# Patient Record
Sex: Male | Born: 1970 | Hispanic: No | Marital: Married | Smoking: Never smoker
Health system: Southern US, Community
[De-identification: ages and names within clinical notes are randomized; demographics above are authoritative.]

## PROBLEM LIST (undated history)

## (undated) DIAGNOSIS — E079 Disorder of thyroid, unspecified: Secondary | ICD-10-CM

## (undated) DIAGNOSIS — E119 Type 2 diabetes mellitus without complications: Secondary | ICD-10-CM

---

## 2003-10-28 ENCOUNTER — Observation Stay (HOSPITAL_COMMUNITY): Admission: RE | Admit: 2003-10-28 | Discharge: 2003-10-29 | Payer: Self-pay | Admitting: Specialist

## 2005-04-06 IMAGING — CR DG LUMBAR SPINE 1V
1 series · 1 of 1 positions shown · non-contrast
Comparison: none

CLINICAL DATA: Surgery at L4-5.
 LATERAL LUMBAR SPINE 
 Lateral lumbar spine view labeled #1 shows the needle position posteriorly beneath the spinous process of L4 directed toward the L4-5 interspace. 
 IMPRESSION
 Needle beneath the spinous process of L4. 
 LATERAL LUMBAR SPINE
 Lateral lumbar spine view labeled #2 shows an instrument between the spinous processes of L3-4 and L4-5 for localization. 
 Localization of L3-4 and L4-5 on last film.

[view not recorded]
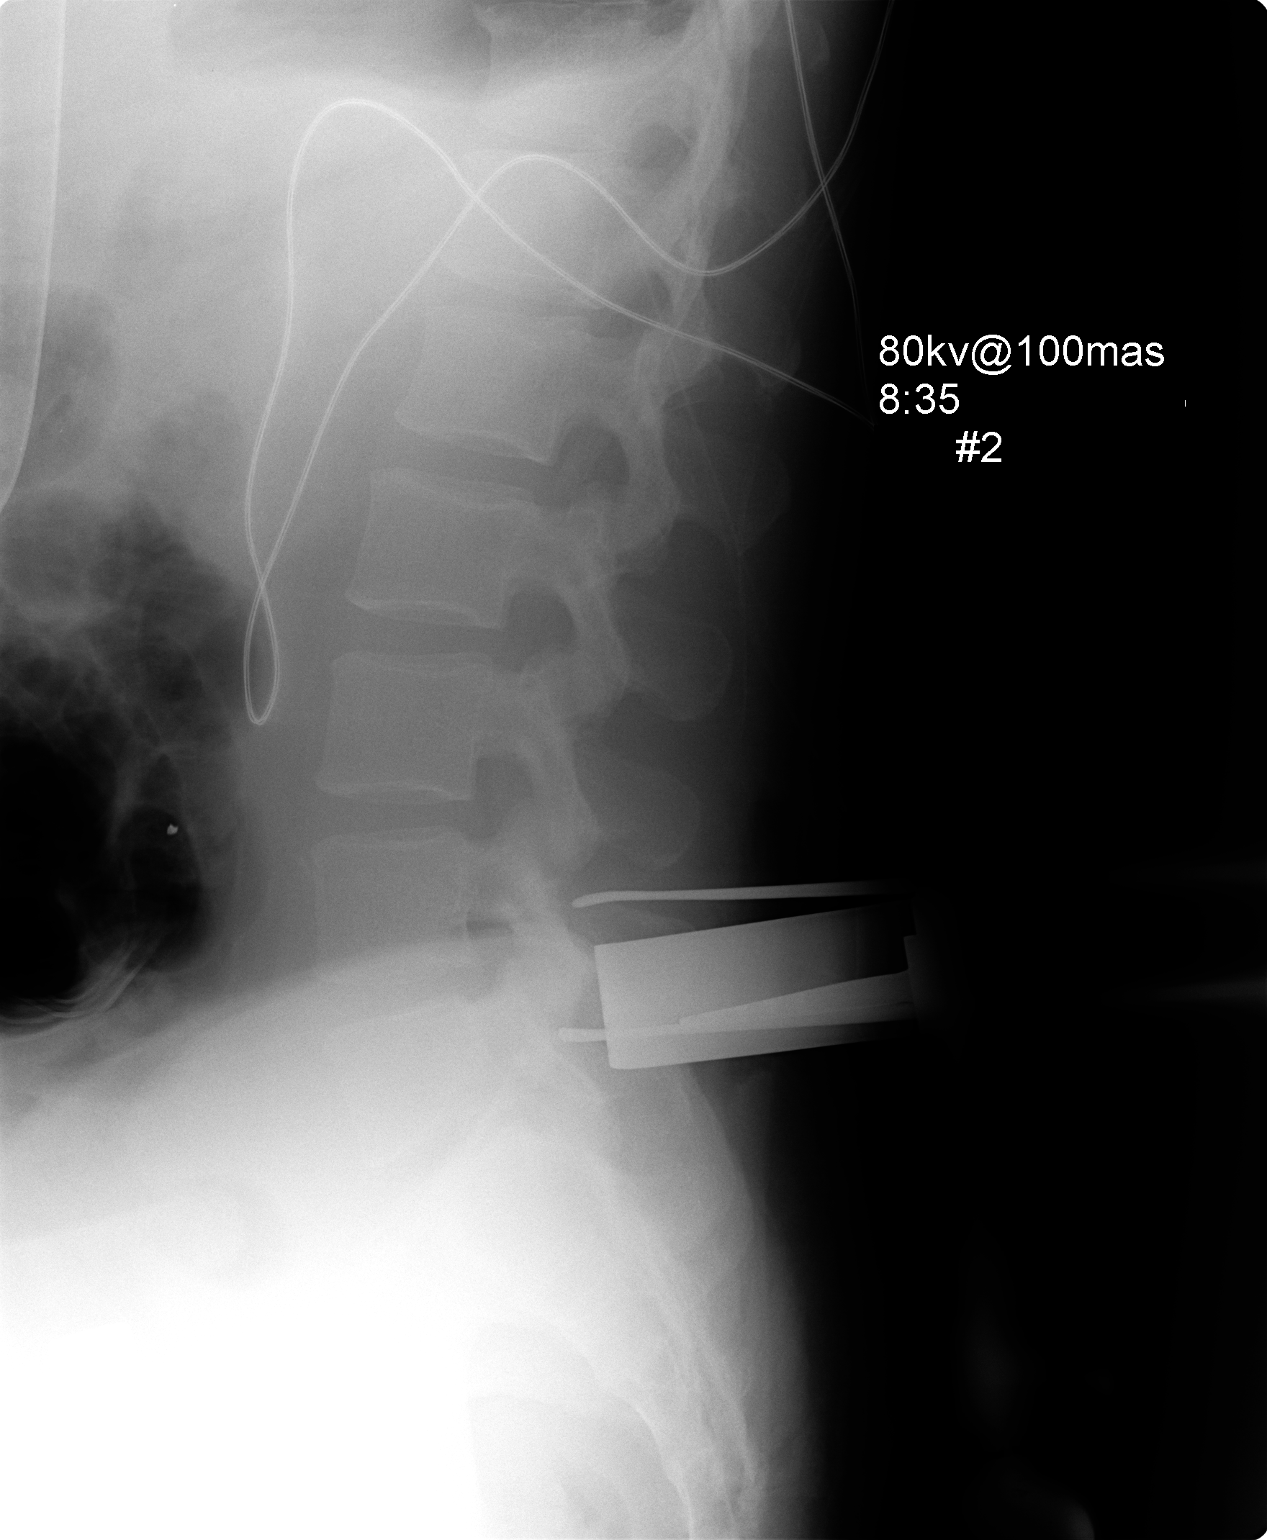

[1 of 1 positions shown; findings below may reference images not displayed]

## 2007-04-30 ENCOUNTER — Ambulatory Visit: Payer: Self-pay | Admitting: Cardiology

## 2022-12-28 ENCOUNTER — Emergency Department (HOSPITAL_COMMUNITY): Payer: Managed Care, Other (non HMO)

## 2022-12-28 ENCOUNTER — Encounter (HOSPITAL_COMMUNITY): Payer: Self-pay | Admitting: Emergency Medicine

## 2022-12-28 ENCOUNTER — Other Ambulatory Visit: Payer: Self-pay

## 2022-12-28 ENCOUNTER — Emergency Department (HOSPITAL_COMMUNITY)
Admission: EM | Admit: 2022-12-28 | Discharge: 2022-12-29 | Disposition: A | Discharge status: Home / Self Care | Payer: Managed Care, Other (non HMO) | Attending: Emergency Medicine | Admitting: Emergency Medicine

## 2022-12-28 DIAGNOSIS — M79674 Pain in right toe(s): Secondary | ICD-10-CM | POA: Diagnosis present

## 2022-12-28 DIAGNOSIS — L600 Ingrowing nail: Secondary | ICD-10-CM

## 2022-12-28 HISTORY — DX: Disorder of thyroid, unspecified: E07.9

## 2022-12-28 HISTORY — DX: Type 2 diabetes mellitus without complications: E11.9

## 2022-12-28 MED ORDER — LIDOCAINE HCL (PF) 2 % IJ SOLN
10.0000 mL | Freq: Once | INTRAMUSCULAR | Status: AC
  Administered 2022-12-29: 10 mL

## 2022-12-28 MED ORDER — LIDOCAINE HCL (PF) 2 % IJ SOLN
INTRAMUSCULAR | Status: AC
  Filled 2022-12-28: qty 10

## 2022-12-28 NOTE — ED Triage Notes (Addendum)
Pt c/o infection to right great toe. Toe appears to have infected area at nail bed. Hx DM2, hypothyroidism. Pain 7/10 burning, last tetanus shot was over 10 years ago. Recently treated for paronychia to left toes and symptoms returned after abx were completed.

## 2022-12-29 MED ORDER — CLINDAMYCIN HCL 150 MG PO CAPS: 300.0000 mg | ORAL_CAPSULE | Freq: Three times a day (TID) | ORAL | 0 refills | Status: AC

## 2022-12-29 MED ORDER — TRIPLE ANTIBIOTIC 3.5-400-5000 EX OINT
TOPICAL_OINTMENT | Freq: Once | CUTANEOUS | Status: AC
  Filled 2022-12-29: qty 1

## 2022-12-29 NOTE — ED Provider Notes (Signed)
[place] EMERGENCY DEPARTMENT AT [facility] Provider Note   CSN: [id number] Arrival date & time: 12/28/22  2202     History  Chief Complaint  Patient presents with   Foot Injury    Alejandro Oconnor is a 52 y.o. male.  Patient presents with progressively worsening pain and swelling of the right great toe over a year.  He weeks, much worse today.  No known injury.       Home Medications Prior to Admission medications   Medication Sig Start Date End Date Taking? Authorizing Provider  clindamycin (CLEOCIN) 150 MG capsule Take 2 capsules (300 mg total) by mouth 3 (three) times daily for 7 days. 12/29/22 01/05/23 Yes Darriona Dehaas, Canary Brim, MD      Allergies    Patient has no known allergies.    Review of Systems   Review of Systems  Physical Exam Updated Vital Signs BP (!) 135/90   Pulse 82   Temp 98.3 F (36.8 C)   Resp 16   Ht 5\' 9"  (1.753 m)   Wt 99.8 kg   SpO2 98%   BMI 32.49 kg/m  Physical Exam Vitals and nursing note reviewed.  Constitutional:      Appearance: Normal appearance.  Musculoskeletal:     Right foot: Swelling (Great toe) and tenderness (Great toe) present.  Skin:    Findings: Erythema (Lateral aspect of right great toe, around nailbed) present.  Neurological:     Mental Status: He is alert.     ED Results / Procedures / Treatments   Labs (all labs ordered are listed, but only abnormal results are displayed) Labs Reviewed - No data to display  EKG None  Radiology DG Toe Great Right  Result Date: 12/29/2022 CLINICAL DATA:  Infection EXAM: RIGHT GREAT TOE COMPARISON:  None Available. FINDINGS: There is no evidence of fracture or dislocation. There is no evidence of arthropathy or other focal bone abnormality. Soft tissues are unremarkable. No soft tissue gas. No bone destruction to suggest osteomyelitis. IMPRESSION: Negative. Electronically Signed   By: Charlett Nose M.D.   On: 12/29/2022 00:04    Procedures .Nail  Removal  Date/Time: 12/29/2022 12:21 AM  Performed by: Gilda Crease, MD Authorized by: Gilda Crease, MD   Consent:    Consent obtained:  Verbal   Consent given by:  Patient   Risks, benefits, and alternatives were discussed: yes     Risks discussed:  Bleeding and pain Universal protocol:    Procedure explained and questions answered to patient or proxy's satisfaction: yes     Relevant documents present and verified: yes     Test results available: yes     Imaging studies available: yes     Required blood products, implants, devices, and special equipment available: yes     Site/side marked: yes     Immediately prior to procedure, a time out was called: yes     Patient identity confirmed:  Verbally with patient Location:    Foot:  R big toe Pre-procedure details:    Skin preparation:  Povidone-iodine   Preparation: Patient was prepped and draped in the usual sterile fashion   Anesthesia:    Anesthesia method:  Nerve block   Block needle gauge:  25 G   Block anesthetic:  Lidocaine 2% w/o epi   Block technique:  Digital   Block injection procedure:  Anatomic landmarks identified, introduced needle, incremental injection, negative aspiration for blood and anatomic landmarks palpated   Block  outcome:  Anesthesia achieved Nail Removal:    Nail removed:  Partial   Nail side:  Medial   Nail bed repaired: no     Removed nail replaced and anchored: no   Ingrown nail:    Wedge excision of skin: yes     Nail matrix removed or ablated:  None Post-procedure details:    Dressing:  Antibiotic ointment and 4x4 sterile gauze   Procedure completion:  Tolerated well, no immediate complications     Medications Ordered in ED Medications  neomycin-bacitracin-polymyxin [phone] OINT (has no administration in time range)  lidocaine HCl (PF) (XYLOCAINE) 2 % injection 10 mL (10 mLs Infiltration Given by Other 12/29/22 0013)    ED Course/ Medical Decision Making/ A&P                              Medical Decision Making Amount and/or Complexity of Data Reviewed Radiology: ordered.  Risk OTC drugs. Prescription drug management.   Presents with redness, swelling around the base of the right great toenail.  Digital block was performed.  An 11 blade was advanced along the nail to ensure there was not a paronychia, no pus was expressed.  A wedge resection of the nail was then performed for treatment of ingrown toenail.  There was a fair amount of deep nail at the most proximal aspect into the lateral nail fold tissue.        Final Clinical Impression(s) / ED Diagnoses Final diagnoses:  Ingrown nail    Rx / DC Orders ED Discharge Orders          Ordered    clindamycin (CLEOCIN) 150 MG capsule  3 times daily        12/29/22 0023              Gilda Crease, MD 12/29/22 [id number]

## 2023-01-11 ENCOUNTER — Emergency Department (HOSPITAL_COMMUNITY): Payer: Managed Care, Other (non HMO)

## 2023-01-11 ENCOUNTER — Emergency Department (HOSPITAL_COMMUNITY)
Admission: EM | Admit: 2023-01-11 | Discharge: 2023-01-11 | Disposition: A | Discharge status: Home / Self Care | Payer: Managed Care, Other (non HMO) | Attending: Emergency Medicine | Admitting: Emergency Medicine

## 2023-01-11 ENCOUNTER — Other Ambulatory Visit: Payer: Self-pay

## 2023-01-11 ENCOUNTER — Encounter (HOSPITAL_COMMUNITY): Payer: Self-pay | Admitting: Emergency Medicine

## 2023-01-11 DIAGNOSIS — M79605 Pain in left leg: Secondary | ICD-10-CM | POA: Diagnosis present

## 2023-01-11 DIAGNOSIS — L600 Ingrowing nail: Secondary | ICD-10-CM | POA: Diagnosis not present

## 2023-01-11 DIAGNOSIS — E119 Type 2 diabetes mellitus without complications: Secondary | ICD-10-CM | POA: Diagnosis not present

## 2023-01-11 MED ORDER — HYDROCODONE-ACETAMINOPHEN 5-325 MG PO TABS: 1.0000 | ORAL_TABLET | Freq: Four times a day (QID) | ORAL | 0 refills | Status: AC | PRN

## 2023-01-11 MED ORDER — AMOXICILLIN-POT CLAVULANATE 875-125 MG PO TABS: 1.0000 | ORAL_TABLET | Freq: Two times a day (BID) | ORAL | 0 refills | Status: AC

## 2023-01-11 NOTE — ED Triage Notes (Signed)
Pt to ER with c/o pain to left great toe pain from nail infection.  States had same to right foot 3 weeks ago.  No known injury.

## 2023-01-11 NOTE — Discharge Instructions (Signed)
Please follow-up with the podiatrist you have seen or the one I have attached your for you in regards to your ingrown toenail.  I have prescribed for you Augmentin to take to cover for any infection along with Norco for pain management.  You may take Tylenol every 6 hours as needed for pain.  If symptoms change or worsen please return to ER.  Also please follow-up with your primary care provider regarding recent symptoms and ER visit to be reevaluated symptoms may change.

## 2023-01-11 NOTE — ED Provider Notes (Signed)
EMERGENCY DEPARTMENT AT [facility] Provider Note   CSN: [id number] Arrival date & time: 01/11/23  1510     History  Chief Complaint  Patient presents with   Toe Pain    Avaan Vink is a 52 y.o. male history of diabetes, ingrown toenails presented for left toe pain has been present for the past 2 weeks.  Patient states this is the same pain he was then when he had a ingrown toenail on his right great toe which was removed during his ER visit.  Patient is tried Tylenol and ibuprofen to no relief.  Patient states that hurts to walk or bear weight on it as this exacerbates pain.  Patient does see podiatry and has appointment on 01/20/2023 however does not think he can wait that long due to the pain.  Patient denies any recent trauma to his toe, warmth, discharge, fevers, nausea/vomiting, changes sensation/motor skills.  Patient dates that his toe is getting more more progressively swollen and red and wants it evaluated.  Patient denied history of gout, decreased sensation, fevers, recent illness, areas of warmth, purulent discharge  Home Medications Prior to Admission medications   Medication Sig Start Date End Date Taking? Authorizing Provider  amoxicillin-clavulanate (AUGMENTIN) 875-125 MG tablet Take 1 tablet by mouth every 12 (twelve) hours. 01/11/23  Yes Janiqua Friscia, Beverly Gust, PA-C  HYDROcodone-acetaminophen (NORCO) 5-325 MG tablet Take 1 tablet by mouth every 6 (six) hours as needed for up to 3 days for moderate pain. 01/11/23 01/14/23 Yes Netta Corrigan, PA-C      Allergies    Patient has no known allergies.    Review of Systems   Review of Systems See HPI Physical Exam Updated Vital Signs BP (!) 146/95 (BP Location: Right Arm)   Pulse 98   Temp 98.6 F (37 C) (Oral)   Resp 18   Ht 5\' 9"  (1.753 m)   Wt 99.8 kg   SpO2 97%   BMI 32.49 kg/m  Physical Exam Constitutional:      General: He is not in acute distress. Cardiovascular:     Pulses: Normal  pulses.  Musculoskeletal:     Comments: Limited range of motion in left great toe due to pain  Skin:    General: Skin is warm and dry.     Capillary Refill: Capillary refill takes less than 2 seconds.     Comments: No areas of fluctuance noted Erythema noted along the left great toe tender to palpation however no abnormalities were palpated  Neurological:     Mental Status: He is alert.     Comments: Sensation intact distally  Psychiatric:        Mood and Affect: Mood normal.     ED Results / Procedures / Treatments   Labs (all labs ordered are listed, but only abnormal results are displayed) Labs Reviewed - No data to display  EKG None  Radiology DG Toe Great Left  Result Date: 01/11/2023 CLINICAL DATA:  Erythema of the great toe from nail infection EXAM: LEFT GREAT TOE COMPARISON:  None Available. FINDINGS: There is no evidence of fracture or dislocation. No cortical irregularity, erosion, or lucency. No subcutaneous emphysema. The visualized soft tissues are unremarkable. IMPRESSION: No radiographic evidence of osteomyelitis. Electronically Signed   By: Jacob Moores M.D.   On: 01/11/2023 16:23    Procedures Procedures    Medications Ordered in ED Medications - No data to display  ED Course/ Medical Decision Making/ A&P  Medical Decision Making  Lanny Vonwald 52 y.o. presented today for left great toe pain. Working DDx that I considered at this time includes, but not limited to, ingrown toenail, gout, septic arthritis, cellulitis, paronychia, fracture, diabetic foot, trench foot.  R/o DDx: gout, septic arthritis, cellulitis, paronychia, fracture, diabetic foot, trench foot: These are considered less likely due to history of present illness and physical exam findings  Review of prior external notes: 12/28/22 ED  Unique Tests and My Interpretation:  Left great toe x-ray: Unremarkable  Discussion with Independent Historian:   Wife  Discussion of Management of Tests: None  Risk: Medium: prescription drug management  Risk Stratification Score: none  Staffed with [place], MD  Plan: On exam patient was in no acute distress and stable vitals.  On exam patient did have what appears to be an ingrown toenail on his left great toe with erythema around the nail.  Patient does have podiatry appointment in the upcoming week.  At this time patient's symptoms appear mild and patient not endorsing systemic infectious symptoms.  No signs of fluctuance or areas of warmth and patient is good pulses, capillary refill, sensation and able to wiggle his toe mildly.  I spoke to the patient about the risk and benefit of having the ingrown toenail removed and we had shared decision making in which the patient stated he felt comfortable being discharged and following up with his podiatrist to have the ingrown toenail removed.  Patient requesting antibiotics and states he cannot take clindamycin as it is upsets his stomach and so patient will be given Augmentin with Norco for breakthrough pain.  Encouraged patient to follow-up with his primary care provider and podiatrist and to return to ER if symptoms are to change or worsen.  Patient was given return precautions. Patient stable for discharge at this time.  Patient verbalized understanding of plan.         Final Clinical Impression(s) / ED Diagnoses Final diagnoses:  Ingrown toenail    Rx / DC Orders ED Discharge Orders          Ordered    HYDROcodone-acetaminophen (NORCO) 5-325 MG tablet  Every 6 hours PRN        01/11/23 1723    amoxicillin-clavulanate (AUGMENTIN) 875-125 MG tablet  Every 12 hours        01/11/23 1724              Remi Deter 01/11/23 1738    Jacalyn Lefevre, MD 01/12/23 [phone]

## 2023-11-10 ENCOUNTER — Emergency Department (HOSPITAL_COMMUNITY)
Admission: EM | Admit: 2023-11-10 | Discharge: 2023-11-10 | Disposition: A | Discharge status: Home / Self Care | Payer: Self-pay | Attending: Emergency Medicine | Admitting: Emergency Medicine

## 2023-11-10 ENCOUNTER — Other Ambulatory Visit: Payer: Self-pay

## 2023-11-10 ENCOUNTER — Encounter (HOSPITAL_COMMUNITY): Payer: Self-pay

## 2023-11-10 DIAGNOSIS — M5417 Radiculopathy, lumbosacral region: Secondary | ICD-10-CM

## 2023-11-10 MED ORDER — DEXAMETHASONE SODIUM PHOSPHATE 10 MG/ML IJ SOLN
10.0000 mg | Freq: Once | INTRAMUSCULAR | Status: AC
  Administered 2023-11-10: 10 mg via INTRAMUSCULAR
  Filled 2023-11-10: qty 1

## 2023-11-10 MED ORDER — ONDANSETRON 4 MG PO TBDP: 4.0000 mg | ORAL_TABLET | Freq: Three times a day (TID) | ORAL | 0 refills | Status: AC | PRN

## 2023-11-10 MED ORDER — DICLOFENAC EPOLAMINE 1.3 % EX PTCH: 1.0000 | MEDICATED_PATCH | Freq: Two times a day (BID) | CUTANEOUS | 1 refills | Status: AC

## 2023-11-10 MED ORDER — HYDROMORPHONE HCL 1 MG/ML IJ SOLN
1.0000 mg | Freq: Once | INTRAMUSCULAR | Status: AC
  Administered 2023-11-10: 1 mg via INTRAMUSCULAR
  Filled 2023-11-10: qty 1

## 2023-11-10 MED ORDER — KETOROLAC TROMETHAMINE 15 MG/ML IJ SOLN
15.0000 mg | Freq: Once | INTRAMUSCULAR | Status: AC
  Administered 2023-11-10: 15 mg via INTRAMUSCULAR
  Filled 2023-11-10: qty 1

## 2023-11-10 MED ORDER — ONDANSETRON 4 MG PO TBDP
4.0000 mg | ORAL_TABLET | Freq: Once | ORAL | Status: AC
  Administered 2023-11-10: 4 mg via ORAL
  Filled 2023-11-10: qty 1

## 2023-11-10 MED ORDER — DICLOFENAC EPOLAMINE 1.3 % EX PTCH
1.0000 | MEDICATED_PATCH | Freq: Two times a day (BID) | CUTANEOUS | Status: DC
  Administered 2023-11-10: 1 via TRANSDERMAL
  Filled 2023-11-10: qty 1

## 2023-11-10 MED ORDER — PREDNISONE 20 MG PO TABS: 40.0000 mg | ORAL_TABLET | Freq: Every day | ORAL | 0 refills | Status: AC

## 2023-11-10 MED ORDER — HYDROCODONE-ACETAMINOPHEN 5-325 MG PO TABS: 1.0000 | ORAL_TABLET | Freq: Four times a day (QID) | ORAL | 0 refills | Status: AC | PRN

## 2023-11-10 NOTE — ED Provider Notes (Signed)
  EMERGENCY DEPARTMENT AT [facility] Provider Note   CSN: [id number] Arrival date & time: 11/10/23  4098     History  No chief complaint on file.   Alejandro Oconnor is a 53 y.o. male.  HPI Chronic back pain, prior lumbar spine surgery presents with ongoing pain in his left leg.  Pain radiates from the buttocks to the left leg.  Patient was seen, evaluated at another nearby hospital 3 days ago, received multiple injections, notes that he had slight improvement, but his symptoms have recurred.  No abdominal pain, no incontinence, no fever, no complete lack of sensation.  Pain is worse with motion, ambulation or weightbearing.  History of surgery was 20 years ago.    Home Medications Prior to Admission medications   Medication Sig Start Date End Date Taking? Authorizing Provider  diclofenac (FLECTOR) 1.3 % PTCH Place 1 patch onto the skin 2 (two) times daily. 11/10/23  Yes Dorenda Gandy, MD  HYDROcodone -acetaminophen  (NORCO/VICODIN) 5-325 MG tablet Take 1 tablet by mouth every 6 (six) hours as needed for severe pain (pain score 7-10). 11/10/23  Yes Dorenda Gandy, MD  ondansetron (ZOFRAN-ODT) 4 MG disintegrating tablet Take 1 tablet (4 mg total) by mouth every 8 (eight) hours as needed for nausea or vomiting. 11/10/23  Yes Dorenda Gandy, MD  predniSONE (DELTASONE) 20 MG tablet Take 2 tablets (40 mg total) by mouth daily with breakfast. For the next four days 11/10/23  Yes Dorenda Gandy, MD  amoxicillin -clavulanate (AUGMENTIN ) 875-125 MG tablet Take 1 tablet by mouth every 12 (twelve) hours. 01/11/23   Denese Finn, PA-C      Allergies    Patient has no known allergies.    Review of Systems   Review of Systems  Physical Exam Updated Vital Signs BP (!) 154/85 (BP Location: Left Arm)   Pulse 87   Temp 98.1 F (36.7 C) (Oral)   Resp 20   Ht 5\' 9"  (1.753 m)   Wt 104.3 kg   SpO2 98%   BMI 33.97 kg/m  Physical Exam Vitals and nursing note reviewed.   Constitutional:      General: He is not in acute distress.    Appearance: He is well-developed.  HENT:     Head: Normocephalic and atraumatic.  Eyes:     Conjunctiva/sclera: Conjunctivae normal.  Cardiovascular:     Rate and Rhythm: Normal rate and regular rhythm.  Pulmonary:     Effort: Pulmonary effort is normal. No respiratory distress.     Breath sounds: No stridor.  Abdominal:     General: There is no distension.  Musculoskeletal:       Arms:     Comments: Patient moves his left leg to command, abduction, adduction, flexion, extension of the hip.  Skin:    General: Skin is warm and dry.  Neurological:     General: No focal deficit present.     Mental Status: He is alert and oriented to person, place, and time.     Comments: Referred pain with left leg motion     ED Results / Procedures / Treatments   Labs (all labs ordered are listed, but only abnormal results are displayed) Labs Reviewed - No data to display  EKG None  Radiology No results found.  Procedures Procedures    Medications Ordered in ED Medications  diclofenac (FLECTOR) 1.3 % 1 patch (has no administration in time range)  HYDROmorphone (DILAUDID) injection 1 mg (has no administration in time range)  ketorolac (TORADOL) 15 MG/ML injection 15 mg (15 mg Intramuscular Given 11/10/23 1000)  HYDROmorphone (DILAUDID) injection 1 mg (1 mg Intramuscular Given 11/10/23 1005)  dexamethasone (DECADRON) injection 10 mg (10 mg Intramuscular Given 11/10/23 1001)  ondansetron (ZOFRAN-ODT) disintegrating tablet 4 mg (4 mg Oral Given 11/10/23 1032)    ED Course/ Medical Decision Making/ A&P                                 Medical Decision Making Adult male with history of lumbar disease requiring surgery in the distant past now presents with left leg signs and symptoms of lumbosacral radiculopathy.  Patient has no abdominal pain, no red flags suggesting CNS dysfunction/cauda equina. Patient received multiple  medications, placed on monitoring given narcotic use as well. Pulse ox 99% on room air normal  Amount and/or Complexity of Data Reviewed External Data Reviewed: notes.    Details: [facility] notes reviewed including imaging studies CT lumbar included below  Risk Prescription drug management. Decision regarding hospitalization.  11/09/2023 4:18 AM EDT   This result has an attachment that is not available.   Exam:  CT Lumbar Spine without Contrast  History:  Lower back pain  Technique: Routine lumbar spine CT without IV contrast. AEC (automated exposure control) and/or manual techniques such as size-specific kV and mAs are employed where appropriate to reduce radiation exposure for all CT exams.  Comparison:  None.  Findings:  There is no evidence of an acute fracture. There are no destructive bone lesions or erosive changes. Small anterior marginal osteophytes present throughout the lumbar spine. There is no acute malalignment. The paraspinous soft tissues are unremarkable.  L1-2:  No significant bony central canal or foraminal stenosis.  L2-3:  No significant bony central canal or foraminal stenosis.  L3-4:  Evidence of mild disc degeneration with minimal marginal spurring. No significant bony central canal or foraminal stenosis.  L4-5:  Moderate disc degeneration with facet overgrowth and disc osteophyte ridging. Mild left-sided bony foraminal stenosis.  L5-S1:  No significant bony central canal or foraminal stenosis. 12:17 PM Patient feeling somewhat better.  He is now accompanied by his wife. We discussed findings.  No evidence for cauda equina, or other CNS complication, no evidence for infection, with fever and the patient's improvement here suggests lumbosacral radiculopathy.  Patient provided multiple follow-up resources, discharged in stable condition.        Final Clinical Impression(s) / ED Diagnoses Final diagnoses:  Lumbosacral radiculopathy    Rx / DC  Orders ED Discharge Orders          Ordered    predniSONE (DELTASONE) 20 MG tablet  Daily with breakfast        11/10/23 1214    diclofenac (FLECTOR) 1.3 % PTCH  2 times daily        11/10/23 1214    ondansetron (ZOFRAN-ODT) 4 MG disintegrating tablet  Every 8 hours PRN        11/10/23 1214    HYDROcodone -acetaminophen  (NORCO/VICODIN) 5-325 MG tablet  Every 6 hours PRN        11/10/23 1214              Dorenda Gandy, MD 11/10/23 1217

## 2023-11-10 NOTE — ED Notes (Signed)
 Called pharmacy regarding diclofenac patch that isn't in our pyxis. Said they would send some down.

## 2023-11-10 NOTE — Discharge Instructions (Signed)
 Please be sure to follow-up with one of our 2 colleagues for additional management of your back pain which is likely due to inflammation of the nerves in the lumbosacral region.  Take all medication as directed, and do not hesitate to return here for concerning changes in your condition.

## 2023-11-10 NOTE — ED Triage Notes (Signed)
 Pt hx of back surgery 20+ yrs ago and has pain since. Last Friday got to hurting worse, so he went to [facility] on Saturday and was given meds for inflammation and pain. Still no relief and OTC meds not helping per pt. Radiates down left leg. CBG 180

## 2023-11-10 NOTE — ED Notes (Signed)
 Pt states he cannot give urine specimen at this time.

## 2023-11-13 ENCOUNTER — Other Ambulatory Visit (HOSPITAL_COMMUNITY): Payer: Self-pay | Admitting: Neurosurgery

## 2023-11-13 DIAGNOSIS — M5440 Lumbago with sciatica, unspecified side: Secondary | ICD-10-CM

## 2023-11-14 ENCOUNTER — Other Ambulatory Visit (HOSPITAL_COMMUNITY): Payer: Self-pay | Admitting: Neurosurgery

## 2023-11-14 DIAGNOSIS — M5440 Lumbago with sciatica, unspecified side: Secondary | ICD-10-CM

## 2023-11-18 ENCOUNTER — Ambulatory Visit (HOSPITAL_COMMUNITY): Payer: Self-pay

## 2023-11-18 ENCOUNTER — Encounter (HOSPITAL_COMMUNITY): Payer: Self-pay

## 2023-11-26 ENCOUNTER — Ambulatory Visit (HOSPITAL_COMMUNITY)

## 2023-11-26 ENCOUNTER — Ambulatory Visit (HOSPITAL_COMMUNITY)
Admission: RE | Admit: 2023-11-26 | Discharge: 2023-11-26 | Disposition: A | Discharge status: Home / Self Care | Source: Ambulatory Visit | Attending: Neurosurgery | Admitting: Neurosurgery

## 2023-11-26 DIAGNOSIS — M5440 Lumbago with sciatica, unspecified side: Secondary | ICD-10-CM | POA: Diagnosis present

## 2023-11-26 MED ORDER — GADOBUTROL 1 MMOL/ML IV SOLN
10.0000 mL | Freq: Once | INTRAVENOUS | Status: AC | PRN
  Administered 2023-11-26: 10 mL via INTRAVENOUS
